# Patient Record
Sex: Male | Born: 1980 | Race: White | Hispanic: No | Marital: Single | Smoking: Never smoker
Health system: Southern US, Community
[De-identification: ages and names within clinical notes are randomized; demographics above are authoritative.]

## PROBLEM LIST (undated history)

## (undated) DIAGNOSIS — S42009A Fracture of unspecified part of unspecified clavicle, initial encounter for closed fracture: Secondary | ICD-10-CM

## (undated) HISTORY — PX: INGUINAL HERNIA REPAIR: SHX194

## (undated) HISTORY — PX: TYMPANOSTOMY TUBE PLACEMENT: SHX32

## (undated) HISTORY — PX: TONSILLECTOMY AND ADENOIDECTOMY: SHX28

---

## 2017-04-21 DIAGNOSIS — S42009A Fracture of unspecified part of unspecified clavicle, initial encounter for closed fracture: Secondary | ICD-10-CM

## 2017-04-21 HISTORY — DX: Fracture of unspecified part of unspecified clavicle, initial encounter for closed fracture: S42.009A

## 2017-04-23 ENCOUNTER — Other Ambulatory Visit (INDEPENDENT_AMBULATORY_CARE_PROVIDER_SITE_OTHER): Payer: Self-pay | Admitting: Orthopaedic Surgery

## 2017-04-23 ENCOUNTER — Encounter (HOSPITAL_BASED_OUTPATIENT_CLINIC_OR_DEPARTMENT_OTHER): Payer: Self-pay | Admitting: *Deleted

## 2017-04-23 ENCOUNTER — Ambulatory Visit (INDEPENDENT_AMBULATORY_CARE_PROVIDER_SITE_OTHER): Payer: BLUE CROSS/BLUE SHIELD | Admitting: Orthopaedic Surgery

## 2017-04-23 DIAGNOSIS — S42022A Displaced fracture of shaft of left clavicle, initial encounter for closed fracture: Secondary | ICD-10-CM | POA: Diagnosis not present

## 2017-04-23 DIAGNOSIS — S42002A Fracture of unspecified part of left clavicle, initial encounter for closed fracture: Secondary | ICD-10-CM
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
## 2017-04-25 ENCOUNTER — Ambulatory Visit (HOSPITAL_BASED_OUTPATIENT_CLINIC_OR_DEPARTMENT_OTHER)
Admission: RE | Admit: 2017-04-25 | Discharge: 2017-04-25 | Disposition: A | Discharge status: Home / Self Care | Payer: BLUE CROSS/BLUE SHIELD | Source: Ambulatory Visit | Attending: Orthopaedic Surgery | Admitting: Orthopaedic Surgery

## 2017-04-25 ENCOUNTER — Encounter (HOSPITAL_BASED_OUTPATIENT_CLINIC_OR_DEPARTMENT_OTHER): Payer: Self-pay | Admitting: *Deleted

## 2017-04-25 ENCOUNTER — Ambulatory Visit (HOSPITAL_BASED_OUTPATIENT_CLINIC_OR_DEPARTMENT_OTHER): Payer: BLUE CROSS/BLUE SHIELD | Admitting: Anesthesiology

## 2017-04-25 ENCOUNTER — Ambulatory Visit (HOSPITAL_COMMUNITY): Payer: BLUE CROSS/BLUE SHIELD

## 2017-04-25 ENCOUNTER — Encounter (HOSPITAL_BASED_OUTPATIENT_CLINIC_OR_DEPARTMENT_OTHER)
Admission: RE | Disposition: A | Discharge status: Home / Self Care | Payer: Self-pay | Source: Ambulatory Visit | Attending: Orthopaedic Surgery

## 2017-04-25 DIAGNOSIS — Y92410 Unspecified street and highway as the place of occurrence of the external cause: Secondary | ICD-10-CM | POA: Diagnosis not present

## 2017-04-25 DIAGNOSIS — Z8781 Personal history of (healed) traumatic fracture: Secondary | ICD-10-CM

## 2017-04-25 DIAGNOSIS — Z9889 Other specified postprocedural states: Secondary | ICD-10-CM

## 2017-04-25 DIAGNOSIS — S42022A Displaced fracture of shaft of left clavicle, initial encounter for closed fracture: Secondary | ICD-10-CM | POA: Insufficient documentation

## 2017-04-25 DIAGNOSIS — S42002A Fracture of unspecified part of left clavicle, initial encounter for closed fracture: Secondary | ICD-10-CM

## 2017-04-25 DIAGNOSIS — Z4789 Encounter for other orthopedic aftercare: Secondary | ICD-10-CM

## 2017-04-25 HISTORY — PX: ORIF CLAVICULAR FRACTURE: SHX5055

## 2017-04-25 HISTORY — DX: Fracture of unspecified part of unspecified clavicle, initial encounter for closed fracture: S42.009A

## 2017-04-25 SURGERY — OPEN REDUCTION INTERNAL FIXATION (ORIF) CLAVICULAR FRACTURE
Anesthesia: General | Site: Shoulder | Laterality: Left

## 2017-04-25 MED ORDER — OXYCODONE HCL 5 MG PO TABS: 5.0000 mg | ORAL_TABLET | ORAL | 0 refills | Status: AC | PRN

## 2017-04-25 MED ORDER — OXYCODONE HCL 5 MG PO TABS
ORAL_TABLET | ORAL | Status: AC
  Filled 2017-04-25: qty 1

## 2017-04-25 MED ORDER — BUPIVACAINE-EPINEPHRINE (PF) 0.25% -1:200000 IJ SOLN
INTRAMUSCULAR | Status: AC
  Filled 2017-04-25: qty 30

## 2017-04-25 MED ORDER — BUPIVACAINE-EPINEPHRINE (PF) 0.25% -1:200000 IJ SOLN
INTRAMUSCULAR | Status: DC | PRN
  Administered 2017-04-25: 30 mL

## 2017-04-25 MED ORDER — ONDANSETRON HCL 4 MG PO TABS: 4.0000 mg | ORAL_TABLET | Freq: Three times a day (TID) | ORAL | 0 refills | Status: AC | PRN

## 2017-04-25 MED ORDER — CEFAZOLIN SODIUM-DEXTROSE 2-4 GM/100ML-% IV SOLN
INTRAVENOUS | Status: AC
  Filled 2017-04-25: qty 100

## 2017-04-25 MED ORDER — KETOROLAC TROMETHAMINE 30 MG/ML IJ SOLN
INTRAMUSCULAR | Status: AC
Start: 2017-04-25 — End: ?
  Filled 2017-04-25: qty 1

## 2017-04-25 MED ORDER — PROPOFOL 10 MG/ML IV BOLUS
INTRAVENOUS | Status: DC | PRN
  Administered 2017-04-25: 200 mg via INTRAVENOUS

## 2017-04-25 MED ORDER — SENNOSIDES-DOCUSATE SODIUM 8.6-50 MG PO TABS: 1.0000 | ORAL_TABLET | Freq: Every evening | ORAL | 1 refills | Status: AC | PRN

## 2017-04-25 MED ORDER — OXYCODONE HCL ER 10 MG PO T12A: 10.0000 mg | EXTENDED_RELEASE_TABLET | Freq: Two times a day (BID) | ORAL | 0 refills | Status: AC

## 2017-04-25 MED ORDER — OXYCODONE HCL 5 MG/5ML PO SOLN: 5.0000 mg | Freq: Once | ORAL | Status: AC | PRN

## 2017-04-25 MED ORDER — LIDOCAINE 2% (20 MG/ML) 5 ML SYRINGE
INTRAMUSCULAR | Status: DC | PRN
  Administered 2017-04-25: 80 mg via INTRAVENOUS

## 2017-04-25 MED ORDER — FENTANYL CITRATE (PF) 100 MCG/2ML IJ SOLN: 50.0000 ug | INTRAMUSCULAR | Status: DC | PRN

## 2017-04-25 MED ORDER — OXYCODONE HCL 5 MG PO TABS
5.0000 mg | ORAL_TABLET | Freq: Once | ORAL | Status: AC | PRN
  Administered 2017-04-25: 5 mg via ORAL

## 2017-04-25 MED ORDER — MIDAZOLAM HCL 2 MG/2ML IJ SOLN
INTRAMUSCULAR | Status: DC | PRN
  Administered 2017-04-25: 2 mg via INTRAVENOUS

## 2017-04-25 MED ORDER — PROMETHAZINE HCL 25 MG PO TABS: 25.0000 mg | ORAL_TABLET | Freq: Four times a day (QID) | ORAL | 1 refills | Status: AC | PRN

## 2017-04-25 MED ORDER — ONDANSETRON HCL 4 MG/2ML IJ SOLN: 4.0000 mg | Freq: Four times a day (QID) | INTRAMUSCULAR | Status: DC | PRN

## 2017-04-25 MED ORDER — FENTANYL CITRATE (PF) 100 MCG/2ML IJ SOLN
INTRAMUSCULAR | Status: DC | PRN
  Administered 2017-04-25 (×4): 50 ug via INTRAVENOUS

## 2017-04-25 MED ORDER — HYDROMORPHONE HCL 1 MG/ML IJ SOLN
INTRAMUSCULAR | Status: AC
  Filled 2017-04-25: qty 0.5

## 2017-04-25 MED ORDER — ROCURONIUM BROMIDE 10 MG/ML (PF) SYRINGE
PREFILLED_SYRINGE | INTRAVENOUS | Status: AC
  Filled 2017-04-25: qty 5

## 2017-04-25 MED ORDER — HYDROMORPHONE HCL 1 MG/ML IJ SOLN: 0.2500 mg | INTRAMUSCULAR | Status: DC | PRN

## 2017-04-25 MED ORDER — FENTANYL CITRATE (PF) 100 MCG/2ML IJ SOLN
INTRAMUSCULAR | Status: AC
  Filled 2017-04-25: qty 2

## 2017-04-25 MED ORDER — ONDANSETRON HCL 4 MG/2ML IJ SOLN
INTRAMUSCULAR | Status: DC | PRN
  Administered 2017-04-25: 4 mg via INTRAVENOUS

## 2017-04-25 MED ORDER — PROPOFOL 10 MG/ML IV BOLUS
INTRAVENOUS | Status: AC
  Filled 2017-04-25: qty 20

## 2017-04-25 MED ORDER — MIDAZOLAM HCL 2 MG/2ML IJ SOLN: 1.0000 mg | INTRAMUSCULAR | Status: DC | PRN

## 2017-04-25 MED ORDER — DEXAMETHASONE SODIUM PHOSPHATE 10 MG/ML IJ SOLN
INTRAMUSCULAR | Status: DC | PRN
  Administered 2017-04-25: 10 mg via INTRAVENOUS

## 2017-04-25 MED ORDER — KETOROLAC TROMETHAMINE 30 MG/ML IJ SOLN
INTRAMUSCULAR | Status: DC | PRN
  Administered 2017-04-25: 30 mg via INTRAVENOUS

## 2017-04-25 MED ORDER — SCOPOLAMINE 1 MG/3DAYS TD PT72: 1.0000 | MEDICATED_PATCH | Freq: Once | TRANSDERMAL | Status: DC | PRN

## 2017-04-25 MED ORDER — LIDOCAINE 2% (20 MG/ML) 5 ML SYRINGE
INTRAMUSCULAR | Status: AC
  Filled 2017-04-25: qty 5

## 2017-04-25 MED ORDER — ONDANSETRON HCL 4 MG/2ML IJ SOLN
INTRAMUSCULAR | Status: AC
  Filled 2017-04-25: qty 2

## 2017-04-25 MED ORDER — SUGAMMADEX SODIUM 200 MG/2ML IV SOLN
INTRAVENOUS | Status: AC
  Filled 2017-04-25: qty 2

## 2017-04-25 MED ORDER — MIDAZOLAM HCL 2 MG/2ML IJ SOLN
INTRAMUSCULAR | Status: AC
  Filled 2017-04-25: qty 2

## 2017-04-25 MED ORDER — DEXAMETHASONE SODIUM PHOSPHATE 10 MG/ML IJ SOLN
INTRAMUSCULAR | Status: AC
  Filled 2017-04-25: qty 1

## 2017-04-25 MED ORDER — CEFAZOLIN SODIUM-DEXTROSE 2-4 GM/100ML-% IV SOLN
2.0000 g | INTRAVENOUS | Status: AC
  Administered 2017-04-25: 2 g via INTRAVENOUS

## 2017-04-25 MED ORDER — SUGAMMADEX SODIUM 200 MG/2ML IV SOLN
INTRAVENOUS | Status: DC | PRN
  Administered 2017-04-25: 200 mg via INTRAVENOUS

## 2017-04-25 MED ORDER — METHOCARBAMOL 750 MG PO TABS: 750.0000 mg | ORAL_TABLET | Freq: Two times a day (BID) | ORAL | 0 refills | Status: AC | PRN

## 2017-04-25 MED ORDER — LACTATED RINGERS IV SOLN
INTRAVENOUS | Status: DC
  Administered 2017-04-25 (×2): via INTRAVENOUS

## 2017-04-25 MED ORDER — ROCURONIUM BROMIDE 50 MG/5ML IV SOSY
PREFILLED_SYRINGE | INTRAVENOUS | Status: DC | PRN
  Administered 2017-04-25: 50 mg via INTRAVENOUS

## 2017-04-25 SURGICAL SUPPLY — 55 items
BENZOIN TINCTURE PRP APPL 2/3 (GAUZE/BANDAGES/DRESSINGS) IMPLANT
BLADE SURG 15 STRL LF DISP TIS (BLADE) ×2 IMPLANT
BLADE SURG 15 STRL SS (BLADE) ×4
CLOSURE WOUND 1/2 X4 (GAUZE/BANDAGES/DRESSINGS)
DRAPE C-ARM 42X72 X-RAY (DRAPES) ×3 IMPLANT
DRAPE IMP U-DRAPE 54X76 (DRAPES) ×3 IMPLANT
DRAPE INCISE IOBAN 66X45 STRL (DRAPES) ×3 IMPLANT
DRAPE U-SHAPE 47X51 STRL (DRAPES) ×9 IMPLANT
DRAPE U-SHAPE 76X120 STRL (DRAPES) ×6 IMPLANT
DRILL 2.6X220MM LONG AO (BIT) ×3 IMPLANT
DRSG MEPILEX BORDER 4X8 (GAUZE/BANDAGES/DRESSINGS) IMPLANT
DRSG PAD ABDOMINAL 8X10 ST (GAUZE/BANDAGES/DRESSINGS) ×3 IMPLANT
DURAPREP 26ML APPLICATOR (WOUND CARE) ×3 IMPLANT
ELECT REM PT RETURN 9FT ADLT (ELECTROSURGICAL) ×3
ELECTRODE REM PT RTRN 9FT ADLT (ELECTROSURGICAL) ×1 IMPLANT
GAUZE SPONGE 4X4 12PLY STRL (GAUZE/BANDAGES/DRESSINGS) ×3 IMPLANT
GLOVE BIO SURGEON STRL SZ 6.5 (GLOVE) ×2 IMPLANT
GLOVE BIO SURGEONS STRL SZ 6.5 (GLOVE) ×1
GLOVE BIOGEL PI IND STRL 7.0 (GLOVE) ×2 IMPLANT
GLOVE BIOGEL PI INDICATOR 7.0 (GLOVE) ×4
GLOVE SKINSENSE NS SZ7.5 (GLOVE) ×2
GLOVE SKINSENSE STRL SZ7.5 (GLOVE) ×1 IMPLANT
GLOVE SURG SYN 7.5  E (GLOVE) ×2
GLOVE SURG SYN 7.5 E (GLOVE) ×1 IMPLANT
GOWN SRG XL LVL 4 BRTHBL STRL (GOWNS) ×1 IMPLANT
GOWN STRL NON-REIN XL LVL4 (GOWNS) ×2
GOWN STRL REUS W/ TWL LRG LVL3 (GOWN DISPOSABLE) ×1 IMPLANT
GOWN STRL REUS W/TWL LRG LVL3 (GOWN DISPOSABLE) ×2
GOWN STRL REUS W/TWL XL LVL3 (GOWN DISPOSABLE) ×3 IMPLANT
MANIFOLD NEPTUNE II (INSTRUMENTS) IMPLANT
PACK ARTHROSCOPY DSU (CUSTOM PROCEDURE TRAY) ×3 IMPLANT
PACK BASIN DAY SURGERY FS (CUSTOM PROCEDURE TRAY) ×3 IMPLANT
PENCIL BUTTON HOLSTER BLD 10FT (ELECTRODE) ×3 IMPLANT
PLATE SUPERIOR MIDSHAFT 6H (Plate) ×3 IMPLANT
SCREW BONE 3.5X14MM (Screw) ×12 IMPLANT
SCREW BONE 3.5X16MM (Screw) ×6 IMPLANT
SLEEVE SCD COMPRESS KNEE MED (MISCELLANEOUS) ×3 IMPLANT
SLING ARM FOAM STRAP LRG (SOFTGOODS) IMPLANT
SLING ARM MED ADULT FOAM STRAP (SOFTGOODS) IMPLANT
SLING ARM XL FOAM STRAP (SOFTGOODS) IMPLANT
SPONGE LAP 18X18 X RAY DECT (DISPOSABLE) ×6 IMPLANT
STRIP CLOSURE SKIN 1/2X4 (GAUZE/BANDAGES/DRESSINGS) IMPLANT
SUCTION FRAZIER HANDLE 10FR (MISCELLANEOUS) ×2
SUCTION TUBE FRAZIER 10FR DISP (MISCELLANEOUS) ×1 IMPLANT
SUT FIBERWIRE #2 38 T-5 BLUE (SUTURE)
SUT MNCRL AB 4-0 PS2 18 (SUTURE) ×3 IMPLANT
SUT VIC AB 0 CT1 18XCR BRD 8 (SUTURE) IMPLANT
SUT VIC AB 0 CT1 8-18 (SUTURE)
SUT VIC AB 2-0 CT1 27 (SUTURE) ×2
SUT VIC AB 2-0 CT1 TAPERPNT 27 (SUTURE) ×1 IMPLANT
SUTURE FIBERWR #2 38 T-5 BLUE (SUTURE) IMPLANT
SYR BULB 3OZ (MISCELLANEOUS) ×3 IMPLANT
TOWEL OR 17X24 6PK STRL BLUE (TOWEL DISPOSABLE) ×3 IMPLANT
TOWEL OR NON WOVEN STRL DISP B (DISPOSABLE) ×3 IMPLANT
YANKAUER SUCT BULB TIP NO VENT (SUCTIONS) ×3 IMPLANT

## 2017-04-25 NOTE — Transfer of Care (Signed)
Immediate Anesthesia Transfer of Care Note  Patient: Joel Sims  Procedure(s) Performed: OPEN REDUCTION INTERNAL FIXATION (ORIF) LEFT CLAVICLE FRACTURE (Left Shoulder)  Patient Location: PACU  Anesthesia Type:General  Level of Consciousness: drowsy and patient cooperative  Airway & Oxygen Therapy: Patient Spontanous Breathing and Patient connected to face mask oxygen  Post-op Assessment: Report given to RN and Post -op Vital signs reviewed and stable  Post vital signs: Reviewed and stable  Last Vitals:  Vitals:   04/25/17 0851  BP: 120/63  Pulse: 68  Resp: 20  Temp: 36.9 C  SpO2: 99%    Last Pain:  Vitals:   04/25/17 0851  TempSrc: Oral  PainSc: 6          Complications: No apparent anesthesia complications

## 2017-04-25 NOTE — Discharge Instructions (Signed)
Post Anesthesia Home Care Instructions  Activity: Get plenty of rest for the remainder of the day. A responsible individual must stay with you for 24 hours following the procedure.  For the next 24 hours, DO NOT: -Drive a car -Advertising copywriter -Drink alcoholic beverages -Take any medication unless instructed by your physician -Make any legal decisions or sign important papers.  Meals: Start with liquid foods such as gelatin or soup. Progress to regular foods as tolerated. Avoid greasy, spicy, heavy foods. If nausea and/or vomiting occur, drink only clear liquids until the nausea and/or vomiting subsides. Call your physician if vomiting continues.  Special Instructions/Symptoms: Your throat may feel dry or sore from the anesthesia or the breathing tube placed in your throat during surgery. If this causes discomfort, gargle with warm salt water. The discomfort should disappear within 24 hours.  If you had a scopolamine patch placed behind your ear for the management of post- operative nausea and/or vomiting:  1. The medication in the patch is effective for 72 hours, after which it should be removed.  Wrap patch in a tissue and discard in the trash. Wash hands thoroughly with soap and water. 2. You may remove the patch earlier than 72 hours if you experience unpleasant side effects which may include dry mouth, dizziness or visual disturbances. 3. Avoid touching the patch. Wash your hands with soap and water after contact with the patch.   Postoperative instructions:  Weightbearing instructions: non weight bearing  Keep your dressing and/or splint clean and dry at all times.  You can remove your dressing on post-operative day #3 and change with a dry/sterile dressing or Band-Aids as needed thereafter.    Incision instructions:  Do not soak your incision for 3 weeks after surgery.  If the incision gets wet, pat dry and do not scrub the incision.  Pain control:  You have been given a  prescription to be taken as directed for post-operative pain control.  In addition, elevate the operative extremity above the heart at all times to prevent swelling and throbbing pain.  Take senokot by mouth twice a day while taking narcotic pain medications to help prevent constipation.  Follow up appointments: 1) 10-14 days for suture removal and wound check. 2) Dr. Roda Shutters as scheduled.   -------------------------------------------------------------------------------------------------------------  After Surgery Pain Control:  After your surgery, post-surgical discomfort or pain is likely. This discomfort can last several days to a few weeks. At certain times of the day your discomfort may be more intense.  Did you receive a nerve block?  A nerve block can provide pain relief for one hour to two days after your surgery. As long as the nerve block is working, you will experience little or no sensation in the area the surgeon operated on.  As the nerve block wears off, you will begin to experience pain or discomfort. It is very important that you begin taking your prescribed pain medication before the nerve block fully wears off. Treating your pain at the first sign of the block wearing off will ensure your pain is better controlled and more tolerable when full-sensation returns. Do not wait until the pain is intolerable, as the medicine will be less effective. It is better to treat pain in advance than to try and catch up.  General Anesthesia:  If you did not receive a nerve block during your surgery, you will need to start taking your pain medication shortly after your surgery and should continue to do so as prescribed by  your surgeon.  Pain Medication:  Most commonly we prescribe Vicodin and Percocet for post-operative pain. Both of these medications contain a combination of acetaminophen (Tylenol) and a narcotic to help control pain.   It takes between 30 and 45 minutes before pain medication  starts to work. It is important to take your medication before your pain level gets too intense.   Nausea is a common side effect of many pain medications. You will want to eat something before taking your pain medicine to help prevent nausea.   If you are taking a prescription pain medication that contains acetaminophen, we recommend that you do not take additional over the counter acetaminophen (Tylenol).  Other pain relieving options:   Using a cold pack to ice the affected area a few times a day (15 to 20 minutes at a time) can help to relieve pain, reduce swelling and bruising.   Elevation of the affected area can also help to reduce pain and swelling.

## 2017-04-25 NOTE — Anesthesia Procedure Notes (Signed)
Procedure Name: Intubation Date/Time: 04/25/2017 9:44 AM Performed by: Pearson GrippeOBERTSON, Alaria Oconnor M Pre-anesthesia Checklist: Patient identified, Emergency Drugs available, Suction available and Patient being monitored Patient Re-evaluated:Patient Re-evaluated prior to induction Oxygen Delivery Method: Circle system utilized Preoxygenation: Pre-oxygenation with 100% oxygen Induction Type: IV induction Ventilation: Mask ventilation without difficulty Laryngoscope Size: Miller and 2 Grade View: Grade I Tube type: Oral Tube size: 7.0 mm Number of attempts: 1 Airway Equipment and Method: Stylet and Oral airway Placement Confirmation: ETT inserted through vocal cords under direct vision,  positive ETCO2 and breath sounds checked- equal and bilateral Secured at: 22 cm Tube secured with: Tape Dental Injury: Teeth and Oropharynx as per pre-operative assessment

## 2017-04-25 NOTE — Anesthesia Preprocedure Evaluation (Signed)
Anesthesia Evaluation  Patient identified by MRN, date of birth, ID band Patient awake    Reviewed: Allergy & Precautions, H&P , NPO status , Patient's Chart, lab work & pertinent test results  Airway Mallampati: II   Neck ROM: full    Dental   Pulmonary neg pulmonary ROS,    breath sounds clear to auscultation       Cardiovascular negative cardio ROS   Rhythm:regular Rate:Normal     Neuro/Psych    GI/Hepatic   Endo/Other    Renal/GU      Musculoskeletal   Abdominal   Peds  Hematology   Anesthesia Other Findings   Reproductive/Obstetrics                             Anesthesia Physical Anesthesia Plan  ASA: I  Anesthesia Plan: General   Post-op Pain Management:    Induction: Intravenous  PONV Risk Score and Plan: 2 and Ondansetron, Dexamethasone, Midazolam and Treatment may vary due to age or medical condition  Airway Management Planned: Oral ETT  Additional Equipment:   Intra-op Plan:   Post-operative Plan: Extubation in OR  Informed Consent: I have reviewed the patients History and Physical, chart, labs and discussed the procedure including the risks, benefits and alternatives for the proposed anesthesia with the patient or authorized representative who has indicated his/her understanding and acceptance.       Plan Discussed with: CRNA, Anesthesiologist and Surgeon  Anesthesia Plan Comments:         Anesthesia Quick Evaluation  

## 2017-04-25 NOTE — H&P (Signed)
    PREOPERATIVE H&P  Chief Complaint: left clavicle fracture  HPI: Joel Sims is a 36 y.o. male who presents for surgical treatment of left clavicle fracture.  He denies any changes in medical history.  Past Medical History:  Diagnosis Date  . Clavicle fracture 04/21/2017   left   Past Surgical History:  Procedure Laterality Date  . INGUINAL HERNIA REPAIR    . TONSILLECTOMY AND ADENOIDECTOMY    . TYMPANOSTOMY TUBE PLACEMENT Bilateral    Social History   Social History  . Marital status: Single    Spouse name: N/A  . Number of children: N/A  . Years of education: N/A   Social History Main Topics  . Smoking status: Never Smoker  . Smokeless tobacco: Current User    Types: Chew  . Alcohol use Yes     Comment: occasionally  . Drug use: Unknown  . Sexual activity: Not Asked   Other Topics Concern  . None   Social History Narrative  . None   History reviewed. No pertinent family history. No Known Allergies Prior to Admission medications   Medication Sig Start Date End Date Taking? Authorizing Provider  calcium carbonate (CALCIUM 600) 600 MG TABS tablet Take 600 mg by mouth 2 (two) times daily with a meal.   Yes [provider]  cholecalciferol (VITAMIN D) 1000 units tablet Take 1,000 Units by mouth daily.   Yes [provider]  HYDROcodone-acetaminophen (NORCO/VICODIN) 5-325 MG tablet Take 1 tablet by mouth every 6 (six) hours as needed for moderate pain.   Yes [provider]  Multiple Vitamin (MULTIVITAMIN) tablet Take 1 tablet by mouth daily.   Yes [provider]     Positive ROS: All other systems have been reviewed and were otherwise negative with the exception of those mentioned in the HPI and as above.  Physical Exam: General: Alert, no acute distress Cardiovascular: No pedal edema Respiratory: No cyanosis, no use of accessory musculature GI: abdomen soft Skin: No lesions in the area of chief  complaint Neurologic: Sensation intact distally Psychiatric: Patient is competent for consent with normal mood and affect Lymphatic: no lymphedema  MUSCULOSKELETAL: exam stable  Assessment: left clavicle fracture  Plan: Plan for Procedure(s): OPEN REDUCTION INTERNAL FIXATION (ORIF) LEFT CLAVICLE FRACTURE  The risks benefits and alternatives were discussed with the patient including but not limited to the risks of nonoperative treatment, versus surgical intervention including infection, bleeding, nerve injury,  blood clots, cardiopulmonary complications, morbidity, mortality, among others, and they were willing to proceed.   Glee ArvinMichael Meir Elwood, MD   04/25/2017 9:16 AM

## 2017-04-25 NOTE — Op Note (Signed)
   Date of Surgery: 04/25/2017  INDICATIONS: Mr. Ross MarcusSherwood is a 36 y.o.-year-old male with a left clavicle fracture;  The patient did consent to the procedure after discussion of the risks and benefits.  PREOPERATIVE DIAGNOSIS: Left displaced midshaft clavicle fracture  POSTOPERATIVE DIAGNOSIS: Same.  PROCEDURE: Open treatment internal fixation of left clavicle fracture  SURGEON: N. Glee ArvinMichael Xu, M.D.  ASSIST: none.  ANESTHESIA:  general  IV FLUIDS AND URINE: See anesthesia.  ESTIMATED BLOOD LOSS: minimal mL.  IMPLANTS: [employer]  DRAINS: none  COMPLICATIONS: None.  DESCRIPTION OF PROCEDURE: The patient was brought to the operating room and placed in the beach chair on the operating table.  The patient had been signed prior to the procedure and this was documented. The patient had the anesthesia placed by the anesthesiologist.  A time-out was performed to confirm that this was the correct patient, site, side and location. The patient did receive antibiotics prior to the incision and was re-dosed during the procedure as needed at indicated intervals.  The patient had the operative extremity prepped and draped in the standard surgical fashion.    A transverse incision was made directly over the left clavicle. Dissection was carried down to the fascia. Cutaneous nerves were identified and protected. Fascia was sharply incised in line with the incision. Subperiosteal elevation was then performed mainly of the superior aspect of the clavicle for exposure. The fracture lines were then visualized and the reduction was performed of the medial and the lateral fracture fragments. This was then clamped and confirmed under fluoroscopy. There was a separate inferior butterfly fragment. Given the fracture orientation I was not able to place a lag screw. With the fracture out to length and overall alignment a precontoured superior clavicle plate was placed on the clavicle at the appropriate position using  fluoroscopic guidance.  Nonlocking screws were placed both medially and laterally from the fracture using standard AO technique each with excellent purchase. The fracture remained reduced after the clamp was removed. I then used 2 #2 FiberWire sutures to cerclage the separate butterfly fragment back to the clavicle. I was able to obtain good bony contact and alignment. Final x-rays were taken. The wound was then thoroughly irrigated and closed in layer fashion using 0 Vicryl, 3-0 Vicryl, 3-0 Monocryl. Sterile dressings were applied. Patient tolerated procedure well and no immediate complications.  POSTOPERATIVE PLAN: patient will be discharged home.  Mayra ReelN. Michael Xu, MD [facility] [phone] 11:34 AM

## 2017-04-25 NOTE — Anesthesia Postprocedure Evaluation (Signed)
Anesthesia Post Note  Patient: Joel Sims  Procedure(s) Performed: OPEN REDUCTION INTERNAL FIXATION (ORIF) LEFT CLAVICLE FRACTURE (Left Shoulder)     Patient location during evaluation: PACU Anesthesia Type: General Level of consciousness: awake and alert Pain management: pain level controlled Vital Signs Assessment: post-procedure vital signs reviewed and stable Respiratory status: spontaneous breathing, nonlabored ventilation, respiratory function stable and patient connected to nasal cannula oxygen Cardiovascular status: blood pressure returned to baseline and stable Postop Assessment: no apparent nausea or vomiting Anesthetic complications: no    Last Vitals:  Vitals:   04/25/17 1215 04/25/17 1239  BP: (!) 111/97 125/70  Pulse: 69 75  Resp: 19 18  Temp:  36.9 C  SpO2: 100% 100%    Last Pain:  Vitals:   04/25/17 1239  TempSrc:   PainSc: 5                  Ruby Logiudice S

## 2017-04-26 ENCOUNTER — Encounter (HOSPITAL_BASED_OUTPATIENT_CLINIC_OR_DEPARTMENT_OTHER): Payer: Self-pay | Admitting: Orthopaedic Surgery

## 2017-05-08 ENCOUNTER — Encounter (INDEPENDENT_AMBULATORY_CARE_PROVIDER_SITE_OTHER): Payer: Self-pay | Admitting: Orthopaedic Surgery

## 2017-05-08 ENCOUNTER — Ambulatory Visit (INDEPENDENT_AMBULATORY_CARE_PROVIDER_SITE_OTHER): Payer: BLUE CROSS/BLUE SHIELD

## 2017-05-08 ENCOUNTER — Ambulatory Visit (INDEPENDENT_AMBULATORY_CARE_PROVIDER_SITE_OTHER): Payer: BLUE CROSS/BLUE SHIELD | Admitting: Orthopaedic Surgery

## 2017-05-08 DIAGNOSIS — S42022A Displaced fracture of shaft of left clavicle, initial encounter for closed fracture: Secondary | ICD-10-CM

## 2017-05-08 NOTE — Progress Notes (Signed)
Patient is 2 weeks status post ORIF left clavicle fracture.  His pain is moderate and waxing and wanes.  Overall he is doing well.  He takes occasional pain medicines.  His range of motion is acceptable.  He is able to raise it up to the level of the shoulder.  The incision is clean dry and intact without any signs of infection.  There is no drainage.  X-rays show stable fixation and alignment of the fracture.  At this point began home exercise program.  Continue nonweightbearing.  Neosporin to the incision twice a day.  Follow-up in 4 weeks with 2 view x-rays of the left clavicle.

## 2017-05-11 ENCOUNTER — Telehealth (INDEPENDENT_AMBULATORY_CARE_PROVIDER_SITE_OTHER): Payer: Self-pay | Admitting: Orthopaedic Surgery

## 2017-05-11 NOTE — Telephone Encounter (Signed)
Patient [place] be [place] out of country this weekend and just had surgery a couple of weeks ago and wants to make sure its ok and that there will be no complications. Please call back to let him know [phone].

## 2017-05-11 NOTE — Telephone Encounter (Signed)
See message below °

## 2017-05-11 NOTE — Telephone Encounter (Signed)
Yeah that's fine

## 2017-05-11 NOTE — Telephone Encounter (Signed)
Spoke with patient and he had already spoken to Dr Roda ShuttersXu

## 2017-05-11 NOTE — Telephone Encounter (Signed)
Tried to call patient no answer. 

## 2017-06-05 ENCOUNTER — Ambulatory Visit (INDEPENDENT_AMBULATORY_CARE_PROVIDER_SITE_OTHER): Payer: BLUE CROSS/BLUE SHIELD | Admitting: Orthopaedic Surgery

## 2017-06-07 ENCOUNTER — Ambulatory Visit (INDEPENDENT_AMBULATORY_CARE_PROVIDER_SITE_OTHER): Payer: BLUE CROSS/BLUE SHIELD | Admitting: Orthopaedic Surgery

## 2017-06-07 ENCOUNTER — Ambulatory Visit (INDEPENDENT_AMBULATORY_CARE_PROVIDER_SITE_OTHER): Payer: BLUE CROSS/BLUE SHIELD

## 2017-06-07 ENCOUNTER — Encounter (INDEPENDENT_AMBULATORY_CARE_PROVIDER_SITE_OTHER): Payer: Self-pay | Admitting: Orthopaedic Surgery

## 2017-06-07 DIAGNOSIS — S42022A Displaced fracture of shaft of left clavicle, initial encounter for closed fracture: Secondary | ICD-10-CM

## 2017-06-07 NOTE — Progress Notes (Signed)
Patient is 6 weeks status post ORIF left clavicle fracture.  He is overall doing well.  He does complain of some numbness around the incision.  Overall he is progressing and improving.  Not taking any pain medicines.  Surgical scar is fully healed.  X-rays show stable fixation with evidence of progressive healing and bony consolidation.  At this point begin physical therapy for strengthening.  Range of motion weight-bear as tolerated.  Follow-up in 6 weeks with 2 view x-rays of the left clavicle.

## 2018-07-11 IMAGING — RF DG CLAVICLE*L*
1 series · 1 of 1 positions shown · non-contrast
Comparison: None

CLINICAL DATA: ORIF of a midshaft left clavicular fracture. Fluoro
time reported is 15 seconds.

EXAM:
LEFT CLAVICLE - 2+ VIEWS; DG C-ARM 61-120 MIN

[Series 1: run · 1 of 1 slices shown]
[im 1/1]
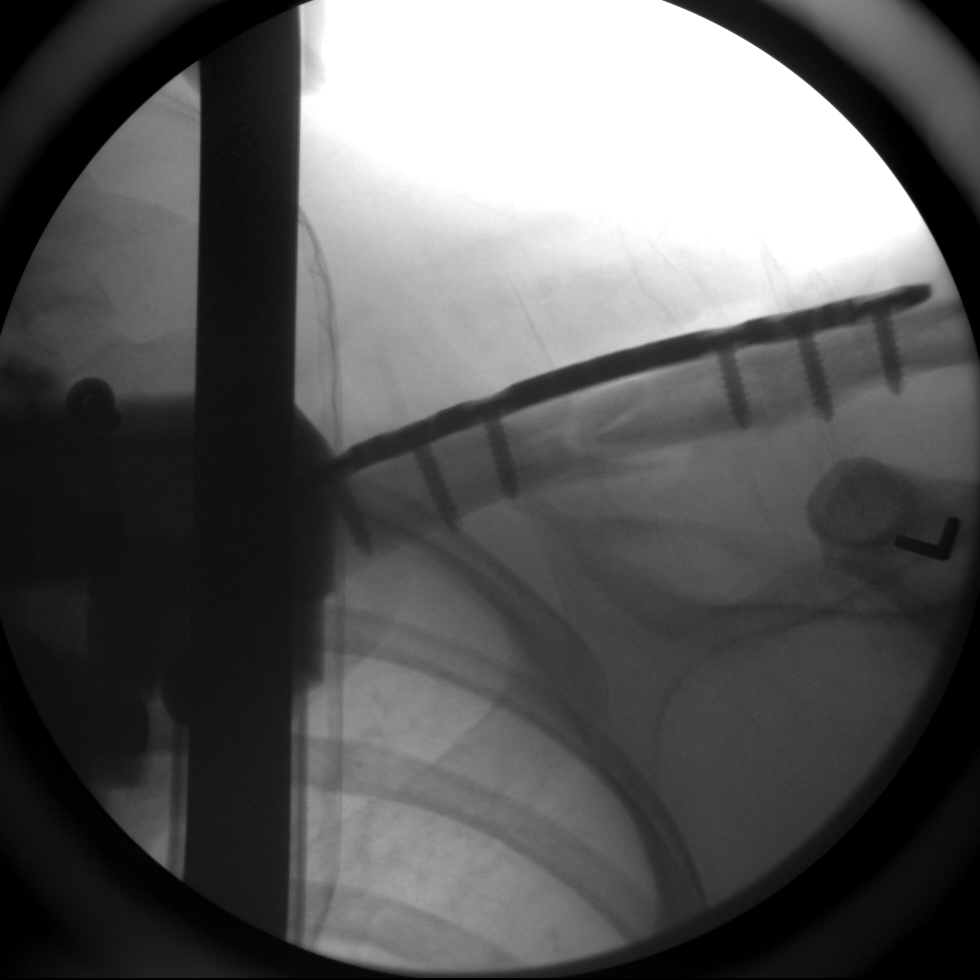

[1 of 1 positions shown; findings below may reference images not displayed]

FINDINGS: The patient has undergone ORIF of a comminuted midshaft left
clavicular fracture. The side plating cortical screws appear
appropriately position. Alignment is now near anatomic.
IMPRESSION: There is no immediate postprocedure complication following ORIF of
the midshaft left clavicular fracture which is now in near anatomic
alignment.

## 2018-07-11 IMAGING — CR DG CHEST 1V
1 series · 1 of 1 positions shown · non-contrast
Comparison: None in PACs

CLINICAL DATA: Post- operative from ORIF of a left clavicular
fracture.

EXAM:
CHEST 1 VIEW

[ap]
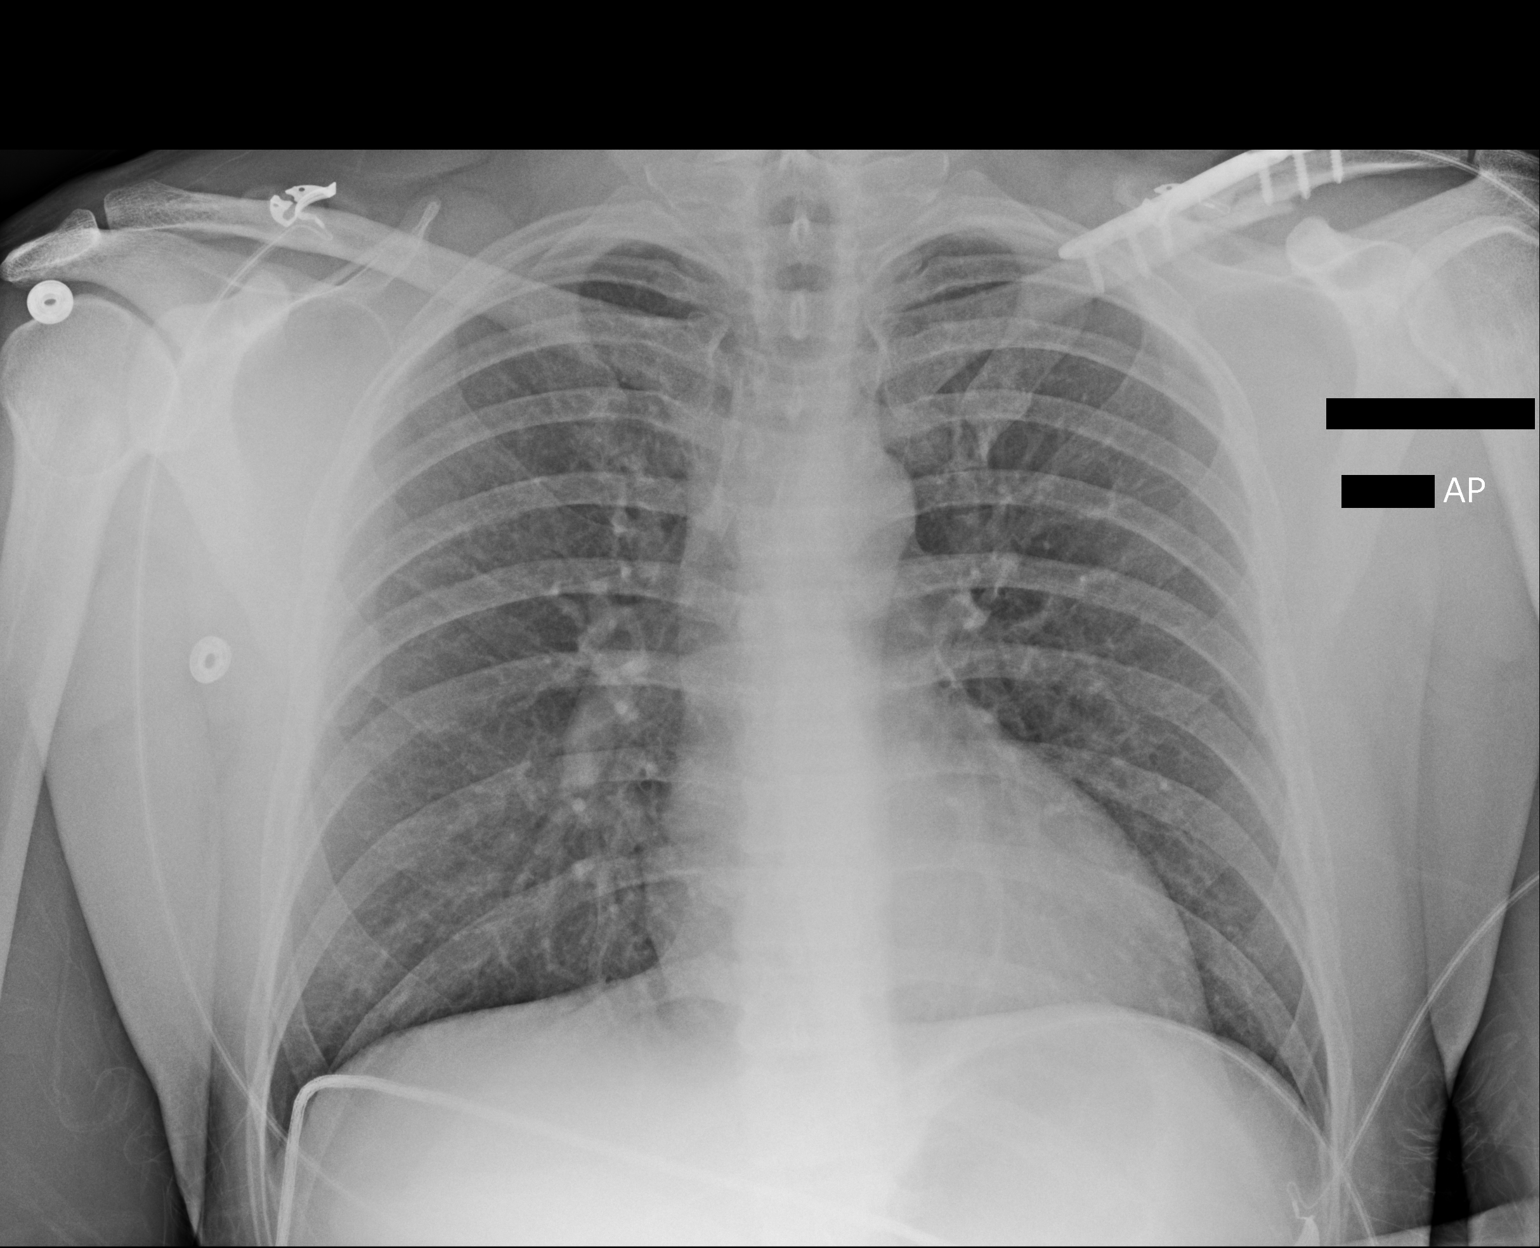

[1 of 1 positions shown; findings below may reference images not displayed]

FINDINGS: The lungs are adequately inflated and clear. There is no
pneumothorax or pneumomediastinum. The heart and pulmonary
vascularity are normal. There is no pleural effusion.
IMPRESSION: There is no pneumothorax nor other postoperative complication
following ORIF of the left clavicular fracture.
